# Patient Record
Sex: Male | Born: 1980 | Race: Asian | Hispanic: No | Marital: Single | Smoking: Current every day smoker
Health system: Southern US, Community
[De-identification: ages and names within clinical notes are randomized; demographics above are authoritative.]

## PROBLEM LIST (undated history)

## (undated) DIAGNOSIS — E119 Type 2 diabetes mellitus without complications: Secondary | ICD-10-CM

---

## 2015-08-19 ENCOUNTER — Emergency Department (HOSPITAL_COMMUNITY)
Admission: EM | Admit: 2015-08-19 | Discharge: 2015-08-19 | Disposition: A | Discharge status: Home / Self Care | Payer: Self-pay | Attending: Emergency Medicine | Admitting: Emergency Medicine

## 2015-08-19 ENCOUNTER — Emergency Department (HOSPITAL_COMMUNITY): Payer: Self-pay

## 2015-08-19 ENCOUNTER — Encounter (HOSPITAL_COMMUNITY): Payer: Self-pay | Admitting: Family Medicine

## 2015-08-19 DIAGNOSIS — M5410 Radiculopathy, site unspecified: Secondary | ICD-10-CM | POA: Insufficient documentation

## 2015-08-19 DIAGNOSIS — E119 Type 2 diabetes mellitus without complications: Secondary | ICD-10-CM | POA: Insufficient documentation

## 2015-08-19 DIAGNOSIS — M792 Neuralgia and neuritis, unspecified: Secondary | ICD-10-CM

## 2015-08-19 DIAGNOSIS — R059 Cough, unspecified: Secondary | ICD-10-CM

## 2015-08-19 DIAGNOSIS — F17200 Nicotine dependence, unspecified, uncomplicated: Secondary | ICD-10-CM | POA: Insufficient documentation

## 2015-08-19 DIAGNOSIS — J9801 Acute bronchospasm: Secondary | ICD-10-CM | POA: Insufficient documentation

## 2015-08-19 DIAGNOSIS — B349 Viral infection, unspecified: Secondary | ICD-10-CM

## 2015-08-19 DIAGNOSIS — R05 Cough: Secondary | ICD-10-CM

## 2015-08-19 HISTORY — DX: Type 2 diabetes mellitus without complications: E11.9

## 2015-08-19 MED ORDER — ALBUTEROL SULFATE HFA 108 (90 BASE) MCG/ACT IN AERS
2.0000 | INHALATION_SPRAY | RESPIRATORY_TRACT | Status: DC | PRN
  Administered 2015-08-19: 2 via RESPIRATORY_TRACT
  Filled 2015-08-19: qty 6.7

## 2015-08-19 MED ORDER — PREDNISONE 20 MG PO TABS: ORAL_TABLET | ORAL | Status: DC

## 2015-08-19 MED ORDER — PREDNISONE 20 MG PO TABS
60.0000 mg | ORAL_TABLET | Freq: Once | ORAL | Status: AC
  Administered 2015-08-19: 60 mg via ORAL
  Filled 2015-08-19: qty 3

## 2015-08-19 MED ORDER — IPRATROPIUM BROMIDE 0.02 % IN SOLN
0.5000 mg | Freq: Once | RESPIRATORY_TRACT | Status: AC
  Administered 2015-08-19: 0.5 mg via RESPIRATORY_TRACT
  Filled 2015-08-19: qty 2.5

## 2015-08-19 MED ORDER — ALBUTEROL SULFATE (2.5 MG/3ML) 0.083% IN NEBU
5.0000 mg | INHALATION_SOLUTION | Freq: Once | RESPIRATORY_TRACT | Status: AC
  Administered 2015-08-19: 5 mg via RESPIRATORY_TRACT
  Filled 2015-08-19: qty 6

## 2015-08-19 MED ORDER — GUAIFENESIN 100 MG/5ML PO LIQD: 100.0000 mg | ORAL | Status: DC | PRN

## 2015-08-19 MED ORDER — TRAMADOL HCL 50 MG PO TABS: 50.0000 mg | ORAL_TABLET | Freq: Four times a day (QID) | ORAL | Status: DC | PRN

## 2015-08-19 MED ORDER — PROMETHAZINE-DM 6.25-15 MG/5ML PO SYRP: 2.5000 mL | ORAL_SOLUTION | Freq: Four times a day (QID) | ORAL | Status: DC | PRN

## 2015-08-19 NOTE — Discharge Instructions (Signed)
Take ultram and ibuprofen as needed at home for your arm pain. Use inhaler 2 puffs every 4 hrs as needed for shortness of breath and wheezing.  Take steroid and cough medications as prescribed.  Use resources below to find a primary care provider.    Bronchospasm, Adult A bronchospasm is when the tubes that carry air in and out of your lungs (airways) spasm or tighten. During a bronchospasm it is hard to breathe. This is because the airways get smaller. A bronchospasm can be triggered by:  Allergies. These may be to animals, pollen, food, or mold.  Infection. This is a common cause of bronchospasm.  Exercise.  Irritants. These include pollution, cigarette smoke, strong odors, aerosol sprays, and paint fumes.  Weather changes.  Stress.  Being emotional. HOME CARE   Always have a plan for getting help. Know when to call your doctor and local emergency services (911 in the U.S[place] Know where you can get emergency care.  Only take medicines as told by your doctor.  If you were prescribed an inhaler or nebulizer machine, ask your doctor how to use it correctly. Always use a spacer with your inhaler if you were given one.  Stay calm during an attack. Try to relax and breathe more slowly.  Control your home environment:  Change your heating and air conditioning filter at least once a month.  Limit your use of fireplaces and wood stoves.  Do not  smoke. Do not  allow smoking in your home.  Avoid perfumes and fragrances.  Get rid of pests (such as roaches and mice) and their droppings.  Throw away plants if you see mold on them.  Keep your house clean and dust free.  Replace carpet with wood, tile, or vinyl flooring. Carpet can trap dander and dust.  Use allergy-proof pillows, mattress covers, and box spring covers.  Wash bed sheets and blankets every week in hot water. Dry them in a dryer.  Use blankets that are made of polyester or cotton.  Wash hands frequently. GET  HELP IF:  You have muscle aches.  You have chest pain.  The thick spit you spit or cough up (sputum) changes from clear or white to yellow, green, gray, or bloody.  The thick spit you spit or cough up gets thicker.  There are problems that may be related to the medicine you are given such as:  A rash.  Itching.  Swelling.  Trouble breathing. GET HELP RIGHT AWAY IF:  You feel you cannot breathe or catch your breath.  You cannot stop coughing.  Your treatment is not helping you breathe better.  You have very bad chest pain. MAKE SURE YOU:   Understand these instructions.  Will watch your condition.  Will get help right away if you are not doing well or get worse.   This information is not intended to replace advice given to you by your health care provider. Make sure you discuss any questions you have with your health care provider.   Document Released: 04/24/2009 Document Revised: 07/18/2014 Document Reviewed: 12/18/2012 Elsevier Interactive Patient Education 2016 [employer].   Emergency Department Resource Guide 1) Find a Doctor and Pay Out of Pocket Although you won't have to find out who is covered by your insurance plan, it is a good idea to ask around and get recommendations. You will then need to call the office and see if the doctor you have chosen will accept you as a new patient and what types  of options they offer for patients who are self-pay. Some doctors offer discounts or will set up payment plans for their patients who do not have insurance, but you will need to ask so you aren't surprised when you get to your appointment.  2) Contact Your Local Health Department Not all health departments have doctors that can see patients for sick visits, but many do, so it is worth a call to see if yours does. If you don't know where your local health department is, you can check in your phone book. The CDC also has a tool to help you locate your state's health  department, and many state websites also have listings of all of their local health departments.  3) Find a Walk-in Clinic If your illness is not likely to be very severe or complicated, you may want to try a walk in clinic. These are popping up all over the country in pharmacies, drugstores, and shopping centers. They're usually staffed by nurse practitioners or physician assistants that have been trained to treat common illnesses and complaints. They're usually fairly quick and inexpensive. However, if you have serious medical issues or chronic medical problems, these are probably not your best option.  No Primary Care Doctor: - Call Health Connect at  [phone] - they can help you locate a primary care doctor that  accepts your insurance, provides certain services, etc. - Physician Referral Service- [phone]  Chronic Pain Problems: Organization         Address  Phone   Notes  [facility]  [phone] Patients need to be referred by their primary care doctor.   Medication Assistance: Organization         Address  Phone   Notes  [facility] Medication [facility] [address] [place], [place] [address] [phone] --Must be a resident of [facility] -- Must have NO insurance coverage whatsoever (no Medicaid/ Medicare, etc.) -- The pt. MUST have a primary care doctor that directs their care regularly and follows them in the community   [facility]  [phone]   [employer]  [phone]    Agencies that provide inexpensive medical care: Organization         Address  Phone   Notes  [facility]  [phone]   [facility]    [phone]   [facility] [address] [place], [place] [address] [phone]   [facility] [address] [place] [place] [phone]   Planned Parenthood    [phone]   [facility]    [phone]    [facility] and [facility]  [address], [place] Phone:  [phone], Fax:  [phone] Hours of Operation:  9 am - 6 pm, M-F.  Also accepts Medicaid/Medicare and self-pay.  [facility] [address] [facility]  [address], [place] Phone: [phone], Fax: [phone]. Hours of Operation:  8:30 am - 5:30 pm, M-F.  Also accepts Medicaid and self-pay.  [facility] [place] [address] [place] Phone: [phone]   [facility] [address] Natasha Bence [place], [place] [phone], Ext. [phone] Mondays & Thursdays: 7-9 AM.  First 15 patients are seen on a first come, first serve basis.    Medicaid-accepting [facility] Providers:  Organization         Address  Phone   Notes  [facility] [address], [place] [phone] Also accepts self-pay patients.  [facility] [address] Laurell Josephs [place], [place]  [phone]   [facility] [address], [place] [phone]   [facility] [address], [place] [phone]   Renaye Rakers [address], [place]   [phone] Only accepts [place] Access [place] patients after they have their name applied to their card.   Self-Pay (no insurance) in [facility]:  Organization         Address  Phone   Notes  Sickle Cell Patients, [facility] [address] [place], [place] [phone]   [facility] [address] [place], [place] [phone]   [facility] [place]  [address], [place] [phone]   [facility]/Dr. Osei-Bonsu  [address] [place] or [address], [place] [phone] Phone number for both [place] and [place] locations is the same.  [facility] [address] [place] [phone]    [facility] [address] [place] or [address] [phone]   [facility] [address], [place] [phone], phone; [phone], fax Sees patients 1st and 3rd Saturday of every month.  Must not qualify for public or private insurance (i.e. Medicaid, Medicare, [place] Health Choice, Veterans' Benefits)  Household income should be no more than 200% of the poverty level The clinic cannot treat you if you are pregnant or think you are pregnant  Sexually transmitted diseases are not treated at the clinic.    Dental Care: Organization         Address  Phone  Notes  [facility] Department of [facility] [address] [place], [place] [phone] Accepts children up to age 54 who are enrolled in [place] or [place] Health Choice; pregnant women with a Medicaid card; and children who have applied for Medicaid or [place] Health Choice, but were declined, whose parents can pay a reduced fee at time of service.  [facility]  [address], [place] [phone] Accepts children up to age 86 who are enrolled in [place] or [place] Health Choice; pregnant women with a Medicaid card; and children who have applied for Medicaid or [place] Health Choice, but were declined, whose parents can pay a reduced fee at time of service.  [facility]  [address] [place], [place] [phone] Patients are seen by appointment only. Walk-ins are not accepted. [facility] will see patients 68 years of age and older. Monday - Tuesday (8am-5pm) Most Wednesdays (8:30-5pm) $30 per visit, cash only  [facility]  [address], [facility] [phone] Patients are seen by appointment only. Walk-ins are not accepted. [facility] will see patients 61 years of age and older. One Wednesday Evening (Monthly: Volunteer Based).   $30 per visit, cash only  [employer]  [phone] for adults; Children under age 36, call Graduate Pediatric Dentistry at [phone]. Children aged 49-14, please call [phone] to request a pediatric application.  Dental services are provided in all areas of dental care including fillings, crowns and bridges, complete and partial  dentures, implants, gum treatment, root canals, and extractions. Preventive care is also provided. Treatment is provided to both adults and children. Patients are selected via a lottery and there is often a waiting list.   [facility] [address], [place]  [phone] [email or web address]   [facility] [address] [place], [place] [phone], Ext. [phone] Second and Fourth Thursday of each month, opens at 6:30 AM; Clinic ends at 9 AM.  Patients are seen on a first-come first-served basis, and a limited number are seen during each clinic.   [facility]  [address] Ether Griffins [place], [place] [phone]   Eligibility Requirements You must have lived in [place], [place], or [place] counties for at least the last three months.   You cannot be eligible for state or federal sponsored [place], including [employer], [place], or [employer].   You generally cannot be eligible for healthcare insurance through your employer.    How to apply: Eligibility screenings are held every Tuesday and Wednesday afternoon from 1:00 pm until 4:00 pm. You do not need an appointment for the interview!  [facility] [address] [place], [place] [phone]   [facility] Health Department  [phone]   [facility] Health Department  [phone]   [facility] Health Department  [phone]    Behavioral Health Resources in the Community: Intensive Outpatient Programs Organization         Address  Phone  Notes  [facility] Services [address] [place], [place] [phone]   [facility]patient [address], [place], [place] [phone]   [facility] [address], [place], [place]  [phone]   [facility] [address],  [place], [place] [phone] or [phone]   Substance Abuse Resources Organization         Address  Phone  Notes  [facility]  [phone]   [facility]  [phone]   [facility] [place]  [phone]   Floydene Flock  [phone]   Residential & Outpatient Substance Abuse Program  [phone]   Psychological Services Organization         Address  Phone  Notes  [facility]  [phone]   [facility]  [phone]   [facility] Mental Health [address], [place] [phone] or [phone]    Mobile Crisis Teams Organization         Address  Phone  Notes  [facility], Mobile Crisis Care Unit  [phone]   [facility]  [address] [place], [place] [phone]   Doristine Locks [address] [place] [phone]    Self-Help/Support Groups Organization         Address  Phone             Notes  [facility] [place] - variety of support groups  336- I7437963 Call for more information  Narcotics Anonymous (NA), [facility] [address], [employer] [place]  2 meetings at this location   Statistician         Address  Phone  Notes  [facility] [address],    [place]  [phone]   [facility]  [address], [place] [address], [place], [place] [phone]   [facility] Treatment Facility [address] [place], [place] [phone] Admissions: 8am-3pm M-F  Incentives  [facility] Treatment Center [address],    [place], [place] [phone]   [facility] [address] [place], [place], [place] [phone]   [facility] [address],  [place], [place] [phone]   [facility] - Intensive Outpatient [address] Laurell Josephs [address], [place], [place] [phone]   [facility] ([facility] [address] [place]  [place], [place] [phone] or [phone]   [facility] ([facility]) [address], [place], [place] [phone] Accepts Medicaid  [facility] [address],  [place] [phone] Substance Abuse/Addiction Treatment   [facility] Organization         Address  Phone  Notes  [facility]  [phone]   Angie Fava, PhD [address] Ervin Knack [place], [place]   4781170690 or [phone]   [facility] Behavioral   [address] [place], [place] [phone]   [facility] [address] [place], [place] [phone] Insurance/Medicaid/sponsorship through [facility] and Families [address]                                    [place], [place] [phone] Therapy/tele-psych/case  [facility] [address], [place] [phone]    Dr. Lolly Mustache  [phone]   [facility]  [employer] [facility] Dept. 1) [address], [place] 2) [address] [place] 3)  [address], [place] [phone]  [phone]   [facility] Child Abuse Hotline [phone] or 8188220746 (After Hours)

## 2015-08-19 NOTE — ED Notes (Signed)
Pt here for cough, congestion. sts also arm pain worse when sitting, driving a car and sleeping at night. sts hurting x 3 weeks.

## 2015-08-19 NOTE — ED Provider Notes (Signed)
CSN: [id number]     Arrival date & time 08/19/15  1311 History  By signing my name below, I, Murriel Hopper, attest that this documentation has been prepared under the direction and in the presence of Fayrene Helper, PA-C.  Electronically Signed: Murriel Hopper, ED Scribe. 08/19/2015. 2:26 PM   Chief Complaint  Patient presents with  . Nasal Congestion  . Cough  . Arm Pain     Patient is a 35 y.o. male presenting with cough and arm pain. The history is provided by the patient. No language interpreter was used.  Cough Associated symptoms: shortness of breath   Associated symptoms: no ear pain, no fever, no sore throat and no wheezing   Arm Pain Associated symptoms include shortness of breath.   HPI Comments: Greig Altergott is a 35 y.o. male who presents to the Emergency Department complaining of constant sinus congestion that has been present for 3 days. Pt reports associated productive cough, and SOB in association with the cough that began three days ago as well. Pt states he has post-tussive chest pain as well, and states he has been taking OTC cough medication for it with mild relief. Pt also reports associated constant, squeezing left arm pain that radiates from his posterior shoulder down the back part of his arm to his hand has been present for a few weeks as well, and states he has been using a topical gel on the area with no relief. Pt denies hx of asthma, blood clots. Pt is a smoker. Pt denies wheezing, fever, chills, sore throat, ear pain, neck pain, coughing up blood. No significant cardiac hx, no family hx of premature cardiac death.  Pt is a smoker.  Denies having exertional chest pain, lightheadedness/dizziness, or diaphoresis.  No prior hx of COPD or asthma.  No environmental changes.     Past Medical History  Diagnosis Date  . Diabetes mellitus without complication (HCC)    History reviewed. No pertinent past surgical history. History reviewed. No pertinent family  history. Social History  Substance Use Topics  . Smoking status: Current Every Day Smoker  . Smokeless tobacco: None  . Alcohol Use: None    Review of Systems  Constitutional: Negative for fever.  HENT: Positive for congestion. Negative for ear pain and sore throat.   Respiratory: Positive for cough and shortness of breath. Negative for wheezing.   Musculoskeletal: Negative for neck pain.      Allergies  Review of patient's allergies indicates no known allergies.  Home Medications   Prior to Admission medications   Not on File   BP 110/80 mmHg  Pulse 86  Temp(Src) 98.3 F (36.8 C) (Oral)  Resp 20  SpO2 93% Physical Exam  Constitutional: He is oriented to person, place, and time. He appears well-developed and well-nourished.  HENT:  Head: Normocephalic and atraumatic.  Rhinorrhea to nose  Throat: uvula midline, no exudates  Anterior cervical lymphadenopathy   Cardiovascular: Normal rate.   Pulmonary/Chest: Effort normal. He has no rales.  Expiratory and inspiratory wheezes throughout No rhonchi   Abdominal: He exhibits no distension.  Musculoskeletal:  Diffuse tenderness throughout left arm including shoulder, upper, and lower arm  No crepitus, overlying skin changes or deformity  Normal grip strength  Normal sensation throughout  Neurological: He is alert and oriented to person, place, and time.  Skin: Skin is warm and dry.  Psychiatric: He has a normal mood and affect.  Nursing note and vitals reviewed.   ED Course  Procedures (  including critical care time)  DIAGNOSTIC STUDIES: Oxygen Saturation is 93% on room air, low by my interpretation.    COORDINATION OF CARE: 2:15 PM Discussed treatment plan with pt at bedside and pt agreed to plan.   Labs Review Labs Reviewed - No data to display  Imaging Review Dg Chest 2 View  08/19/2015  CLINICAL DATA:  Chest pain, cough. EXAM: CHEST  2 VIEW COMPARISON:  None. FINDINGS: The heart size and mediastinal  contours are within normal limits. Both lungs are clear. No pneumothorax or pleural effusion is noted. The visualized skeletal structures are unremarkable. IMPRESSION: No active cardiopulmonary disease. Electronically Signed   By: Lupita Raider, M.D.   On: 08/19/2015 14:03   I have personally reviewed and evaluated these images and lab results as part of my medical decision-making.   EKG Interpretation None      MDM   Final diagnoses:  Acute bronchospasm due to viral infection  Radicular pain in left arm   BP 110/80 mmHg  Pulse 86  Temp(Src) 98.3 F (36.8 C) (Oral)  Resp 20  SpO2 93%   I personally performed the services described in this documentation, which was scribed in my presence. The recorded information has been reviewed and is accurate.     2:51 PM Patient presents with 2 separate symptoms. Her symptoms is nasal congestion cough, wheezing. It appears he has developed acute bronchospasm secondary to a viral etiology. His chest x-ray shows no signs of pneumonia or other acute changes. Although patient does not have any history of asthma, he will receive DuoNeb treatment in the ED along with steroid.  Patient also has persistent left arm pain for the past 3 weeks. It appears to be radiculopathy in origin. Questionable Erb's palsy. However patient has no significant neck pain, no signs of infection, no significant risk factor concerning for DVT or PE. I encourage taking additional pain medication, rice therapy, and steroid. Outpatient resources provided. Return precaution discussed. Otherwise patient is stable for discharge.   Fayrene Helper, PA-C 08/19/15 2251  Samuel Jester, DO 08/23/15 2124

## 2015-08-19 NOTE — ED Notes (Signed)
Pt ambulated with pulse ox monitor on, oxygen saturation did not drop below 96% while walking.

## 2015-10-15 ENCOUNTER — Emergency Department (HOSPITAL_COMMUNITY)
Admission: EM | Admit: 2015-10-15 | Discharge: 2015-10-15 | Disposition: A | Discharge status: Home / Self Care | Payer: Self-pay | Attending: Emergency Medicine | Admitting: Emergency Medicine

## 2015-10-15 ENCOUNTER — Emergency Department (HOSPITAL_COMMUNITY): Payer: Self-pay

## 2015-10-15 ENCOUNTER — Encounter (HOSPITAL_COMMUNITY): Payer: Self-pay | Admitting: *Deleted

## 2015-10-15 DIAGNOSIS — E119 Type 2 diabetes mellitus without complications: Secondary | ICD-10-CM | POA: Insufficient documentation

## 2015-10-15 DIAGNOSIS — F17210 Nicotine dependence, cigarettes, uncomplicated: Secondary | ICD-10-CM | POA: Insufficient documentation

## 2015-10-15 DIAGNOSIS — J441 Chronic obstructive pulmonary disease with (acute) exacerbation: Secondary | ICD-10-CM | POA: Insufficient documentation

## 2015-10-15 DIAGNOSIS — J449 Chronic obstructive pulmonary disease, unspecified: Secondary | ICD-10-CM

## 2015-10-15 DIAGNOSIS — R51 Headache: Secondary | ICD-10-CM | POA: Insufficient documentation

## 2015-10-15 LAB — CBG MONITORING, ED: Glucose-Capillary: 165 mg/dL — ABNORMAL HIGH (ref 65–99)

## 2015-10-15 LAB — RAPID STREP SCREEN (MED CTR MEBANE ONLY): Streptococcus, Group A Screen (Direct): NEGATIVE

## 2015-10-15 MED ORDER — BENZONATATE 200 MG PO CAPS: 200.0000 mg | ORAL_CAPSULE | Freq: Three times a day (TID) | ORAL | Status: DC | PRN

## 2015-10-15 MED ORDER — IPRATROPIUM-ALBUTEROL 0.5-2.5 (3) MG/3ML IN SOLN
3.0000 mL | Freq: Once | RESPIRATORY_TRACT | Status: AC
  Administered 2015-10-15: 3 mL via RESPIRATORY_TRACT
  Filled 2015-10-15: qty 3

## 2015-10-15 MED ORDER — GUAIFENESIN 100 MG/5ML PO SOLN
5.0000 mL | Freq: Once | ORAL | Status: AC
  Administered 2015-10-15: 100 mg via ORAL
  Filled 2015-10-15: qty 5

## 2015-10-15 MED ORDER — ALBUTEROL SULFATE HFA 108 (90 BASE) MCG/ACT IN AERS
2.0000 | INHALATION_SPRAY | RESPIRATORY_TRACT | Status: DC | PRN
  Administered 2015-10-15: 2 via RESPIRATORY_TRACT
  Filled 2015-10-15: qty 6.7

## 2015-10-15 MED ORDER — PREDNISONE 10 MG PO TABS: 20.0000 mg | ORAL_TABLET | Freq: Two times a day (BID) | ORAL | Status: DC

## 2015-10-15 MED ORDER — PHENYLEPH-PROMETHAZINE-COD 5-6.25-10 MG/5ML PO SYRP: 5.0000 mL | ORAL_SOLUTION | Freq: Four times a day (QID) | ORAL | Status: DC | PRN

## 2015-10-15 NOTE — ED Notes (Signed)
Pt c/o non productive cough & sore throat, pt reports runny nose & R rib cage pain with cough, pt denies fever & chills, pt denies SOB & CP, pt denies relief with OTC meds

## 2015-10-15 NOTE — ED Provider Notes (Signed)
CSN: [id number]     Arrival date & time 10/15/15  1705 History  By signing my name below, I, Lorenza Chick, attest that this documentation has been prepared under the direction and in the presence of Kerrie Buffalo, NP. Electronically Signed: Lorenza Chick, ED Scribe. 10/15/2015. 6:38 PM.  Chief Complaint  Patient presents with  . Cough    Patient is a 35 y.o. male presenting with cough.  Cough Cough characteristics:  Dry Severity:  Moderate Onset quality:  Gradual Duration:  5 days Timing:  Constant Progression:  Unchanged Chronicity:  New Smoker: yes   Ineffective treatments:  Cough suppressants Associated symptoms: headaches and wheezing   Associated symptoms: no chills and no fever    HPI Comments: Tracy Pierce is a 35 y.o. male with PMHx DM who presents to the Emergency Department complaining of constant dry cough x several days. Pt also complains post nasal drip, congestion, wheezing, cervical lymphadenopathy, and headaches.  Pt has taken OTC cough medicine with no relief. Pt smokes 1-2 packs per day. Pt denies fever, chills, abdominal pain, sinus tenderness, and throat soreness.   Past Medical History  Diagnosis Date  . Diabetes mellitus without complication (HCC)    History reviewed. No pertinent past surgical history. No family history on file. Social History  Substance Use Topics  . Smoking status: Current Every Day Smoker -- 1.50 packs/day    Types: Cigarettes  . Smokeless tobacco: None  . Alcohol Use: No    Review of Systems  Constitutional: Negative for fever and chills.  HENT: Positive for congestion and postnasal drip. Negative for sinus pressure.   Respiratory: Positive for cough and wheezing.   Gastrointestinal: Negative for abdominal pain.  Neurological: Positive for headaches.  All other systems reviewed and are negative.  Allergies  Review of patient's allergies indicates no known allergies.  Home Medications   Prior to Admission medications    Medication Sig Start Date End Date Taking? Authorizing Provider  benzonatate (TESSALON) 200 MG capsule Take 1 capsule (200 mg total) by mouth 3 (three) times daily as needed for cough. 10/15/15   Bryceton Hantz Orlene Och, NP  Phenyleph-Promethazine-Cod 5-6.25-10 MG/5ML SYRP Take 5 mLs by mouth every 6 (six) hours as needed. 10/15/15   Richy Spradley Orlene Och, NP  predniSONE (DELTASONE) 10 MG tablet Take 2 tablets (20 mg total) by mouth 2 (two) times daily with a meal. 10/15/15   Ada Holness Orlene Och, NP   BP 128/81 mmHg  Pulse 67  Temp(Src) 98.7 F (37.1 C) (Oral)  Resp 18  SpO2 100%   Physical Exam  Constitutional: He is oriented to person, place, and time. He appears well-developed and well-nourished. No distress.  HENT:  Head: Normocephalic and atraumatic.  Right Ear: Tympanic membrane normal.  Left Ear: Tympanic membrane normal.  Mouth/Throat: Uvula is midline. No posterior oropharyngeal edema or posterior oropharyngeal erythema.  Eyes: Conjunctivae and EOM are normal. Pupils are equal, round, and reactive to light.  Neck: Neck supple. No tracheal deviation present.  Cardiovascular: Normal rate, regular rhythm and normal heart sounds.   Pulmonary/Chest: Effort normal. No respiratory distress. He has wheezes (expiratory wheezes bilaterally). He has no rales.  Musculoskeletal: Normal range of motion.  Neurological: He is alert and oriented to person, place, and time.  Skin: Skin is warm and dry.  Psychiatric: He has a normal mood and affect. His behavior is normal.  Nursing note and vitals reviewed.   ED Course  Procedures (including critical care time)  DIAGNOSTIC STUDIES: Oxygen Saturation  is 100% on RA, normal by my interpretation.    COORDINATION OF CARE: 6:15 PM-Discussed treatment plan which includes breathing treatment and CXR with pt at bedside and pt agreed to plan.   7:40 PM - Upon reevaluation, pt no longer has wheezes after breathing treatment  Labs Review Labs Reviewed  RAPID STREP SCREEN (NOT  AT [facility])  CULTURE, GROUP A STREP ([facility])  CBG MONITORING, ED    Imaging Review Dg Chest 2 View  10/15/2015  CLINICAL DATA:  Cough and sore throat EXAM: CHEST  2 VIEW COMPARISON:  08/19/2015 FINDINGS: The heart size and mediastinal contours are within normal limits. Moderate diffuse bronchial wall thickening and coarsened interstitial markings noted. Lungs are hyperinflated. Both lungs are clear. The visualized skeletal structures are unremarkable. IMPRESSION: 1. Changes suggestive of COPD. Electronically Signed   By: Signa Kellaylor  Stroud M.D.   On: 10/15/2015 18:59    MDM  35 y.o. male with chronic dry cough stable for d/c with improvement after Duoneb and O2 SAT 100% on R/A. Will treat with tessalon, tussionex and short course of steroids. Patient encouraged to f/u with his PCP to discuss ongoing treatment for COPD. Patient agrees with plan.   Final diagnoses:  Chronic obstructive pulmonary disease, unspecified COPD type (HCC)   I personally performed the services described in this documentation, which was scribed in my presence. The recorded information has been reviewed and is accurate.   DunkertonHope M Brieanne Mignone, NP 10/15/15 2001  Nelva Nayobert Beaton, MD 10/15/15 2352

## 2015-10-15 NOTE — Discharge Instructions (Signed)
It is important that you follow up with your primary care doctor to discuss continued treatment.

## 2015-10-15 NOTE — ED Notes (Signed)
Pt reports non productive cough, and fatigue. Ongoing for several days. Expiratory wheezing noted upon arrival to exam room.

## 2015-10-18 LAB — CULTURE, GROUP A STREP (THRC)

## 2015-11-12 ENCOUNTER — Emergency Department (HOSPITAL_COMMUNITY): Payer: Self-pay

## 2015-11-12 ENCOUNTER — Encounter (HOSPITAL_COMMUNITY): Payer: Self-pay | Admitting: Emergency Medicine

## 2015-11-12 ENCOUNTER — Emergency Department (HOSPITAL_COMMUNITY)
Admission: EM | Admit: 2015-11-12 | Discharge: 2015-11-12 | Disposition: A | Discharge status: Home / Self Care | Payer: Self-pay | Attending: Emergency Medicine | Admitting: Emergency Medicine

## 2015-11-12 DIAGNOSIS — J441 Chronic obstructive pulmonary disease with (acute) exacerbation: Secondary | ICD-10-CM | POA: Insufficient documentation

## 2015-11-12 DIAGNOSIS — Z7952 Long term (current) use of systemic steroids: Secondary | ICD-10-CM | POA: Insufficient documentation

## 2015-11-12 DIAGNOSIS — F17210 Nicotine dependence, cigarettes, uncomplicated: Secondary | ICD-10-CM | POA: Insufficient documentation

## 2015-11-12 DIAGNOSIS — E119 Type 2 diabetes mellitus without complications: Secondary | ICD-10-CM | POA: Insufficient documentation

## 2015-11-12 LAB — CBG MONITORING, ED: GLUCOSE-CAPILLARY: 221 mg/dL — AB (ref 65–99)

## 2015-11-12 MED ORDER — LEVOFLOXACIN 750 MG PO TABS: 750.0000 mg | ORAL_TABLET | Freq: Every day | ORAL | Status: AC

## 2015-11-12 MED ORDER — ALBUTEROL SULFATE HFA 108 (90 BASE) MCG/ACT IN AERS
2.0000 | INHALATION_SPRAY | Freq: Once | RESPIRATORY_TRACT | Status: AC
  Administered 2015-11-12: 2 via RESPIRATORY_TRACT
  Filled 2015-11-12: qty 6.7

## 2015-11-12 MED ORDER — LEVOFLOXACIN 500 MG PO TABS
500.0000 mg | ORAL_TABLET | Freq: Once | ORAL | Status: AC
  Administered 2015-11-12: 500 mg via ORAL
  Filled 2015-11-12: qty 1

## 2015-11-12 MED ORDER — BENZONATATE 100 MG PO CAPS: 100.0000 mg | ORAL_CAPSULE | Freq: Three times a day (TID) | ORAL | Status: AC

## 2015-11-12 MED ORDER — PROMETHAZINE-CODEINE 6.25-10 MG/5ML PO SYRP: 7.5000 mL | ORAL_SOLUTION | Freq: Four times a day (QID) | ORAL | Status: AC | PRN

## 2015-11-12 MED ORDER — IPRATROPIUM-ALBUTEROL 0.5-2.5 (3) MG/3ML IN SOLN
3.0000 mL | Freq: Once | RESPIRATORY_TRACT | Status: AC
  Administered 2015-11-12: 3 mL via RESPIRATORY_TRACT
  Filled 2015-11-12: qty 3

## 2015-11-12 NOTE — Discharge Instructions (Signed)
Please follow with your primary care doctor in the next 2 days for a check-up. They must obtain records for further management.   Do not hesitate to return to the Emergency Department for any new, worsening or concerning symptoms.    Chronic Obstructive Pulmonary Disease Exacerbation Chronic obstructive pulmonary disease (COPD) is a common lung problem. In COPD, the flow of air from the lungs is limited. COPD exacerbations are times that breathing gets worse and you need extra treatment. Without treatment they can be life threatening. If they happen often, your lungs can become more damaged. If your COPD gets worse, your doctor may treat you with:  Medicines.  Oxygen.  Different ways to clear your airway, such as using a mask. HOME CARE  Do not smoke.  Avoid tobacco smoke and other things that bother your lungs.  If given, take your antibiotic medicine as told. Finish the medicine even if you start to feel better.  Only take medicines as told by your doctor.  Drink enough fluids to keep your pee (urine) clear or pale yellow (unless your doctor has told you not to).  Use a cool mist machine (vaporizer).  If you use oxygen or a machine that turns liquid medicine into a mist (nebulizer), continue to use them as told.  Keep up with shots (vaccinations) as told by your doctor.  Exercise regularly.  Eat healthy foods.  Keep all doctor visits as told. GET HELP RIGHT AWAY IF:  You are very short of breath and it gets worse.  You have trouble talking.  You have bad chest pain.  You have blood in your spit (sputum).  You have a fever.  You keep throwing up (vomiting).  You feel weak, or you pass out (faint).  You feel confused.  You keep getting worse. MAKE SURE YOU:  Understand these instructions.  Will watch your condition.  Will get help right away if you are not doing well or get worse.   This information is not intended to replace advice given to you by your  health care provider. Make sure you discuss any questions you have with your health care provider.   Document Released: 06/16/2011 Document Revised: 07/18/2014 Document Reviewed: 03/01/2013 Elsevier Interactive Patient Education [employer]2016 [employer]

## 2015-11-12 NOTE — ED Notes (Signed)
CBG 221, Emilie RN aware.

## 2015-11-12 NOTE — ED Provider Notes (Signed)
CSN: [id number]     Arrival date & time 11/12/15  1907 History  By signing my name below, I, Freida Busmaniana Omoyeni, attest that this documentation has been prepared under the direction and in the presence of non-physician practitioner, Wynetta EmeryNicole Danea Manter, PA-C. Electronically Signed: Freida Busmaniana Omoyeni, Scribe. 11/12/2015. 8:16 PM.    Chief Complaint  Patient presents with  . Cough    The history is provided by the patient. No language interpreter was used.     HPI Comments:  Tracy Pierce is a 35 y.o. 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 Pt was seen in the ED for same ~ 1 month ago. He was given neb tx in the ED and discharged with tessalon, prednisone, and tussinex. He was also advised to quit smoking which he did for ~ 2 weeks. He notes when he began smoking again and his symptoms worsened.  He denies fever, and CP.    Past Medical History  Diagnosis Date  . Diabetes mellitus without complication (HCC)    History reviewed. No pertinent past surgical history. No family history on file. Social History  Substance Use Topics  . Smoking status: Current Every Day Smoker -- 1.50 packs/day    Types: Cigarettes  . Smokeless tobacco: None  . Alcohol Use: No    Review of Systems  10 systems reviewed and all are negative for acute change except as noted in the HPI.  Allergies  Review of patient's allergies indicates no known allergies.  Home Medications   Prior to Admission medications   Medication Sig Start Date End Date Taking? Authorizing Provider  benzonatate (TESSALON) 200 MG capsule Take 1 capsule (200 mg total) by mouth 3 (three) times daily as needed for cough. 10/15/15   Hope Orlene OchM Neese, NP  Phenyleph-Promethazine-Cod 5-6.25-10 MG/5ML SYRP Take 5 mLs by mouth every 6 (six) hours as needed. 10/15/15   Hope Orlene OchM Neese, NP  predniSONE (DELTASONE) 10 MG tablet Take 2 tablets (20 mg total) by mouth 2  (two) times daily with a meal. 10/15/15   Hope Orlene OchM Neese, NP   BP 123/81 mmHg  Pulse 108  Temp(Src) 98.6 F (37 C) (Oral)  Resp 20  SpO2 96% Physical Exam  Constitutional: He is oriented to person, place, and time. He appears well-developed and well-nourished. No distress.  HENT:  Head: Normocephalic and atraumatic.  Mouth/Throat: Oropharynx is clear and moist.  Eyes: Conjunctivae and EOM are normal. Pupils are equal, round, and reactive to light.  Neck: Normal range of motion.  Cardiovascular: Normal rate, regular rhythm and intact distal pulses.   Pulmonary/Chest: Effort normal. He has wheezes.  Moderate scattered expiratory wheezing  Abdominal: Soft. He exhibits no distension. There is no tenderness.  Musculoskeletal: Normal range of motion.  Neurological: He is alert and oriented to person, place, and time.  Skin: Skin is warm and dry. He is not diaphoretic.  Psychiatric: He has a normal mood and affect.  Nursing note and vitals reviewed.   ED Course  Procedures   DIAGNOSTIC STUDIES:  Oxygen Saturation is 96% on RA, normal by my interpretation.    COORDINATION OF CARE:  8:10 PM Discussed treatment plan with pt at bedside and pt agreed to plan.  Smoking cessation instruction/counseling given:  counseled patient on the dangers of tobacco use, advised patient to stop smoking, and reviewed strategies to maximize success  Imaging Review Dg Chest 2 View  11/12/2015  CLINICAL  DATA:  Dry cough for the past 2-3 days. History diabetes. Former smoker. EXAM: CHEST  2 VIEW COMPARISON:  10/15/2015; 08/19/2015 FINDINGS: Grossly unchanged cardiac silhouette and mediastinal contours. The lungs remain hyperexpanded with mild diffuse slightly nodular thickening of the pulmonary interstitium, most conspicuous within the bilateral lung apices, progressed since the 08/19/2015 examination. No focal airspace opacities. No pleural effusion or pneumothorax. No evidence of edema. Minimal pleural  parenchymal thickening about the right minor and bilateral major fissures. No acute osseous abnormalities. Unchanged mild (under 25%) compression deformity involving a lower thoracic vertebral body. IMPRESSION: Findings suggestive of airways disease / bronchitis superimposed on emphysematous change. No focal airspace opacities to suggest pneumonia. Electronically Signed   By: Simonne Come M.D.   On: 11/12/2015 20:04   I have personally reviewed and evaluated these images and lab results as part of my medical decision-making.   EKG Interpretation None      MDM   Final diagnoses:  None    Filed Vitals:   11/12/15 1915 11/12/15 2158  BP: 123/81 117/76  Pulse: 108 88  Temp: 98.6 F (37 C)   TempSrc: Oral   Resp: 20 16  SpO2: 96% 98%    Medications  ipratropium-albuterol (DUONEB) 0.5-2.5 (3) MG/3ML nebulizer solution 3 mL (3 mLs Nebulization Given 11/12/15 2040)  levofloxacin (LEVAQUIN) tablet 500 mg (500 mg Oral Given 11/12/15 2108)  albuterol (PROVENTIL HFA;VENTOLIN HFA) 108 (90 Base) MCG/ACT inhaler 2 puff (2 puffs Inhalation Given 11/12/15 2212)    Tracy Pierce is 35 y.o. male presenting with Rhinorrhea and cough, extensive wheezing on my exam, prior chest x-ray with COPD, counseled patient on file importance of smoking cessation. Chest x-rays without infiltrate, lung sounds have cleared after nebulizer treatment. Patient is insulin-dependent diabetic, states he doesn't check his blood sugar at home, CBG in the ED is 221. Patient is given referral to pulmonologist, given him a albuterol inhaler while in the ED, will start him on Levaquin for COPD exacerbation.  Evaluation does not show pathology that would require ongoing emergent intervention or inpatient treatment. Pt is hemodynamically stable and mentating appropriately. Discussed findings and plan with patient/guardian, who agrees with care plan. All questions answered. Return precautions discussed and outpatient follow up given.    Discharge Medication List as of 11/12/2015 10:02 PM    START taking these medications   Details  benzonatate (TESSALON) 100 MG capsule Take 1 capsule (100 mg total) by mouth every 8 (eight) hours., Starting 11/12/2015, Until Discontinued, Print    levofloxacin (LEVAQUIN) 750 MG tablet Take 1 tablet (750 mg total) by mouth daily. X 7 days, Starting 11/12/2015, Until Discontinued, Print    promethazine-codeine (PHENERGAN WITH CODEINE) 6.25-10 MG/5ML syrup Take 7.5 mLs by mouth every 6 (six) hours as needed for cough., Starting 11/12/2015, Until Discontinued, Print         I personally performed the services described in this documentation, which was scribed in my presence. The recorded information has been reviewed and is accurate.   Wynetta Emery, PA-C 11/13/15 4098  Gerhard Munch, MD 11/13/15 2129

## 2015-11-12 NOTE — ED Notes (Signed)
Pt taken to XRAY

## 2015-11-12 NOTE — ED Notes (Addendum)
Pt st's he has had a non-productive cough for several days.  Pt st's he was here for same on 4/6 and got better after medication but now he is sick again.  Pt denies any chest pain

## 2015-11-12 NOTE — ED Notes (Signed)
Pt left at this time with all belongings.  

## 2016-05-10 IMAGING — CR DG CHEST 2V
2 series · 2 of 2 positions shown · non-contrast
Comparison: 10/15/2015; 08/19/2015

CLINICAL DATA: Dry cough for the past 2-3 days. History diabetes.
Former smoker.

EXAM:
CHEST  2 VIEW

[chest pa]
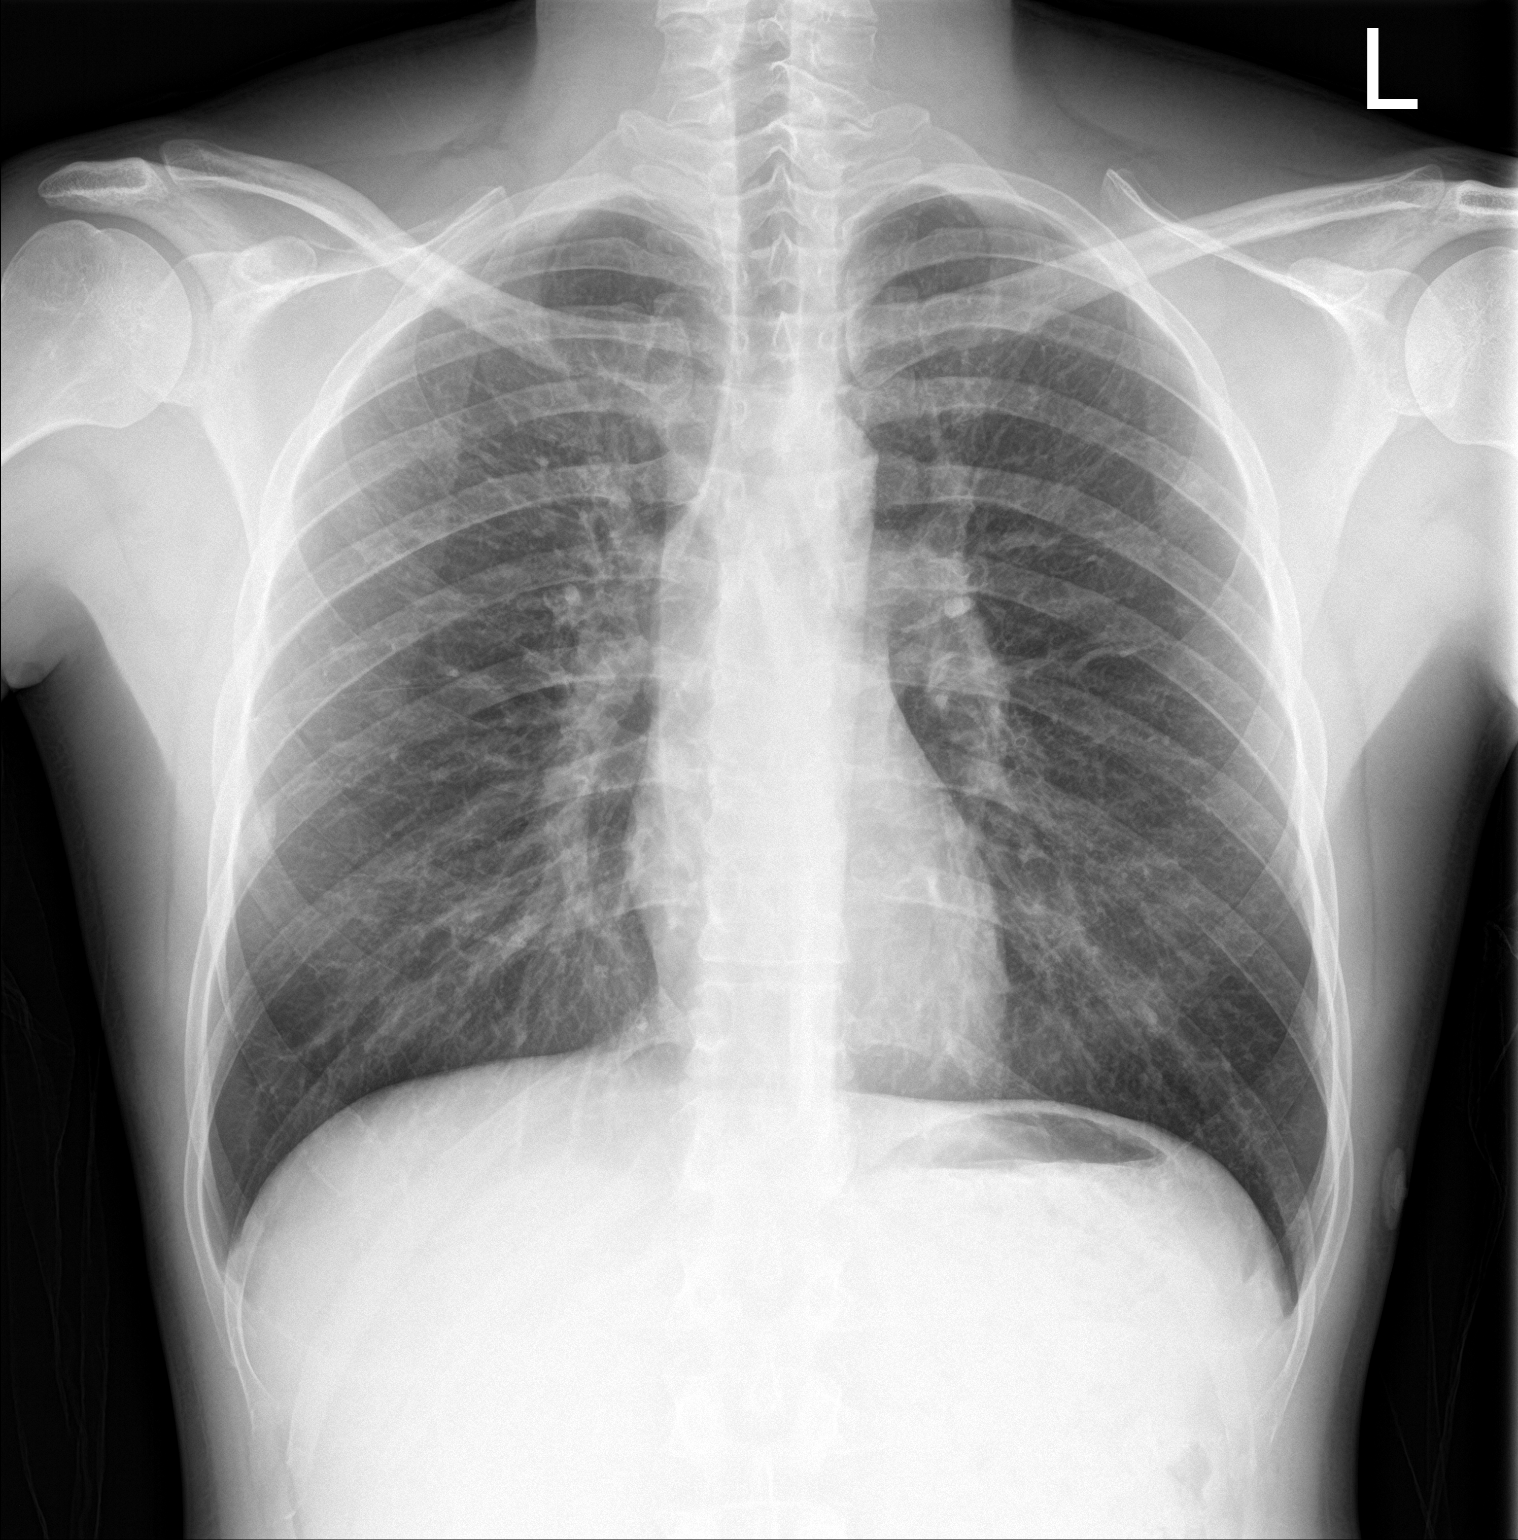

[chest lat]
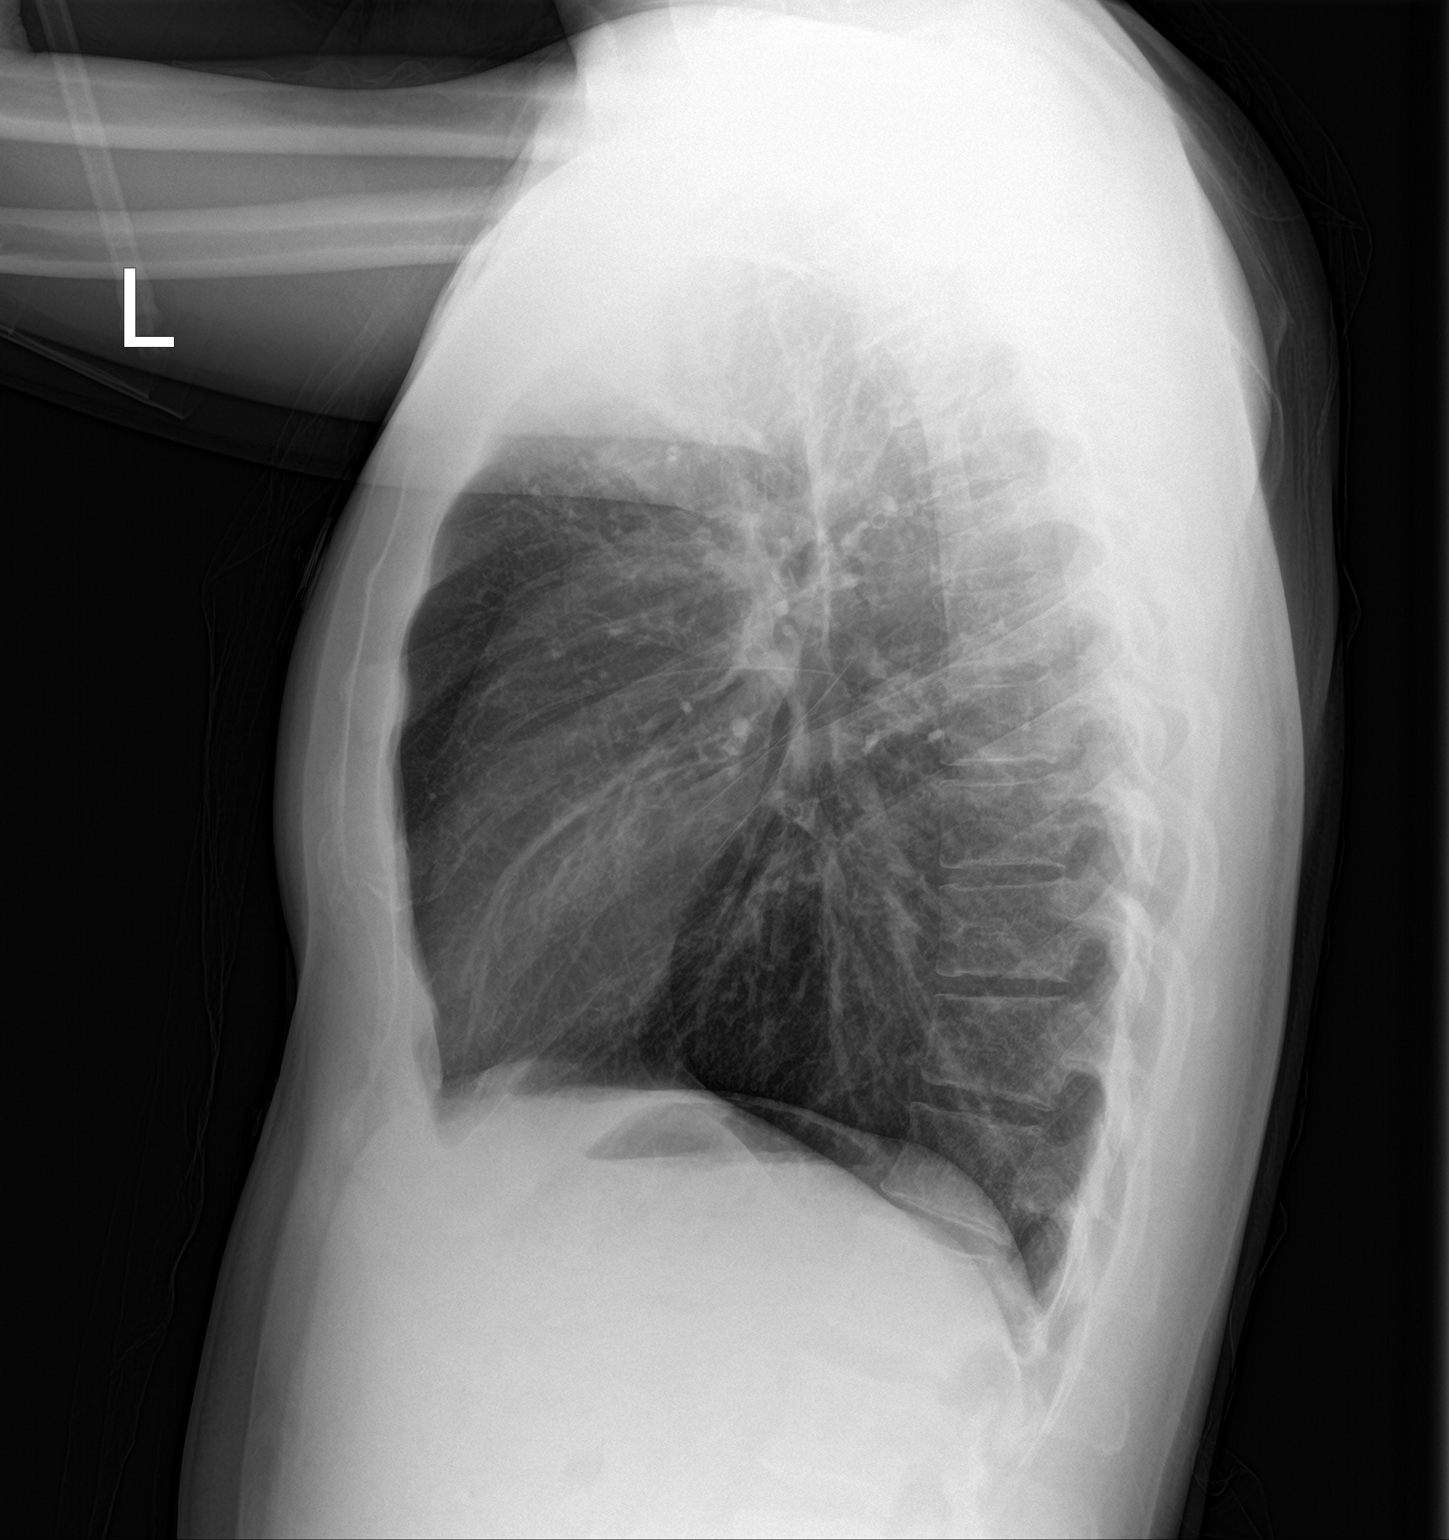

[2 of 2 positions shown; findings below may reference images not displayed]

FINDINGS: Grossly unchanged cardiac silhouette and mediastinal contours. The
lungs remain hyperexpanded with mild diffuse slightly nodular
thickening of the pulmonary interstitium, most conspicuous within
the bilateral lung apices, progressed since the 08/19/2015
examination. No focal airspace opacities. No pleural effusion or
pneumothorax. No evidence of edema. Minimal pleural parenchymal
thickening about the right minor and bilateral major fissures. No
acute osseous abnormalities. Unchanged mild (under 25%) compression
deformity involving a lower thoracic vertebral body.
IMPRESSION: Findings suggestive of airways disease / bronchitis superimposed on
emphysematous change. No focal airspace opacities to suggest
pneumonia.
